# Patient Record
Sex: Female | Born: 1970 | Hispanic: Yes | Marital: Single
Health system: Southern US, Community
[De-identification: ages and names within clinical notes are randomized; demographics above are authoritative.]

## PROBLEM LIST (undated history)

## (undated) DIAGNOSIS — E119 Type 2 diabetes mellitus without complications: Secondary | ICD-10-CM

## (undated) DIAGNOSIS — I10 Essential (primary) hypertension: Secondary | ICD-10-CM

---

## 2019-11-15 ENCOUNTER — Encounter (HOSPITAL_COMMUNITY): Payer: Self-pay | Admitting: Emergency Medicine

## 2019-11-15 ENCOUNTER — Emergency Department (HOSPITAL_COMMUNITY): Payer: Self-pay

## 2019-11-15 ENCOUNTER — Other Ambulatory Visit: Payer: Self-pay

## 2019-11-15 ENCOUNTER — Emergency Department (HOSPITAL_COMMUNITY)
Admission: EM | Admit: 2019-11-15 | Discharge: 2019-11-16 | Disposition: A | Discharge status: Home / Self Care | Payer: Self-pay | Attending: Emergency Medicine | Admitting: Emergency Medicine

## 2019-11-15 DIAGNOSIS — I10 Essential (primary) hypertension: Secondary | ICD-10-CM | POA: Insufficient documentation

## 2019-11-15 DIAGNOSIS — K5900 Constipation, unspecified: Secondary | ICD-10-CM | POA: Insufficient documentation

## 2019-11-15 DIAGNOSIS — E119 Type 2 diabetes mellitus without complications: Secondary | ICD-10-CM | POA: Insufficient documentation

## 2019-11-15 DIAGNOSIS — R109 Unspecified abdominal pain: Secondary | ICD-10-CM | POA: Insufficient documentation

## 2019-11-15 HISTORY — DX: Type 2 diabetes mellitus without complications: E11.9

## 2019-11-15 HISTORY — DX: Essential (primary) hypertension: I10

## 2019-11-15 LAB — CBC WITH DIFFERENTIAL/PLATELET
Abs Immature Granulocytes: 0.01 10*3/uL (ref 0.00–0.07)
Basophils Absolute: 0 10*3/uL (ref 0.0–0.1)
Basophils Relative: 1 %
Eosinophils Absolute: 0.2 10*3/uL (ref 0.0–0.5)
Eosinophils Relative: 3 %
HCT: 43.8 % (ref 36.0–46.0)
Hemoglobin: 15.3 g/dL — ABNORMAL HIGH (ref 12.0–15.0)
Immature Granulocytes: 0 %
Lymphocytes Relative: 35 %
Lymphs Abs: 2.1 10*3/uL (ref 0.7–4.0)
MCH: 28.3 pg (ref 26.0–34.0)
MCHC: 34.9 g/dL (ref 30.0–36.0)
MCV: 81 fL (ref 80.0–100.0)
Monocytes Absolute: 0.4 10*3/uL (ref 0.1–1.0)
Monocytes Relative: 7 %
Neutro Abs: 3.2 10*3/uL (ref 1.7–7.7)
Neutrophils Relative %: 54 %
Platelets: 293 10*3/uL (ref 150–400)
RBC: 5.41 MIL/uL — ABNORMAL HIGH (ref 3.87–5.11)
RDW: 11.9 % (ref 11.5–15.5)
WBC: 6 10*3/uL (ref 4.0–10.5)
nRBC: 0 % (ref 0.0–0.2)

## 2019-11-15 LAB — BASIC METABOLIC PANEL
Anion gap: 13 (ref 5–15)
BUN: 12 mg/dL (ref 6–20)
CO2: 22 mmol/L (ref 22–32)
Calcium: 9.9 mg/dL (ref 8.9–10.3)
Chloride: 97 mmol/L — ABNORMAL LOW (ref 98–111)
Creatinine, Ser: 0.55 mg/dL (ref 0.44–1.00)
GFR, Estimated: >60 mL/min (ref >60)
Glucose, Bld: 479 mg/dL — ABNORMAL HIGH (ref 70–99)
Potassium: 4.2 mmol/L (ref 3.5–5.1)
Sodium: 132 mmol/L — ABNORMAL LOW (ref 135–145)

## 2019-11-15 MED ORDER — LACTATED RINGERS IV BOLUS
1000.0000 mL | Freq: Once | INTRAVENOUS | Status: AC
  Administered 2019-11-15: 1000 mL via INTRAVENOUS

## 2019-11-15 MED ORDER — SORBITOL 70 % SOLN
960.0000 mL | TOPICAL_OIL | Freq: Once | ORAL | Status: AC
  Administered 2019-11-16: 460 mL via RECTAL
  Filled 2019-11-15: qty 473

## 2019-11-15 MED ORDER — FLEET ENEMA 7-19 GM/118ML RE ENEM: 1.0000 | ENEMA | Freq: Once | RECTAL | Status: DC

## 2019-11-15 MED ORDER — ACETAMINOPHEN 325 MG PO TABS
650.0000 mg | ORAL_TABLET | Freq: Once | ORAL | Status: AC
  Administered 2019-11-15: 650 mg via ORAL
  Filled 2019-11-15: qty 2

## 2019-11-15 NOTE — ED Triage Notes (Signed)
Pt from home. Complaint of constipation. Pt states that she has not had a bowel movement in 3 days. Had same thing 3 months ago. VSS.

## 2019-11-15 NOTE — ED Provider Notes (Signed)
[facility] EMERGENCY DEPARTMENT Provider Note   CSN: [id number] Arrival date & time: 11/15/19  1830     History Chief Complaint  Patient presents with  . Constipation    Emma Schwartz is a 49 y.o. female.  Patient reports that about 3 months ago she had a similar episode.  She was living in [place] at the time and had a few days without stools.  She went to an ER where she had a CT scan that showed significant constipation, and she was provided an enema with significant relief of her symptoms.  She reports this episode feels very similar to that episode.  The history is provided by the patient.  Constipation Severity:  Severe Time since last bowel movement:  3 days Timing:  Constant Progression:  Worsening Chronicity:  New Stool description:  None produced Relieved by:  Nothing Worsened by:  Nothing Associated symptoms: abdominal pain and nausea   Associated symptoms: no back pain, no dysuria, no fever, no flatus and no vomiting        Past Medical History:  Diagnosis Date  . Diabetes (HCC)   . Hypertension     History reviewed. No pertinent surgical history.   OB History   No obstetric history on file.     No family history on file.  Social History   Tobacco Use  . Smoking status: Not on file  Substance Use Topics  . Alcohol use: Not on file  . Drug use: Not on file    Home Medications Prior to Admission medications   Not on File    Allergies    Patient has no known allergies.  Review of Systems   Review of Systems  Constitutional: Negative for chills and fever.  HENT: Negative for ear pain and sore throat.   Eyes: Negative for pain and visual disturbance.  Respiratory: Negative for cough and shortness of breath.   Cardiovascular: Negative for chest pain and palpitations.  Gastrointestinal: Positive for abdominal pain, constipation and nausea. Negative for flatus and vomiting.  Genitourinary: Negative for  dysuria and hematuria.  Musculoskeletal: Negative for arthralgias and back pain.  Skin: Negative for color change and rash.  Neurological: Negative for seizures and syncope.  All other systems reviewed and are negative.   Physical Exam Updated Vital Signs BP 138/71 (BP Location: Right Arm)   Pulse 94   Temp 98.3 F (36.8 C) (Oral)   Resp 14   Ht 5\' 8"  (1.727 m)   Wt 86.2 kg   LMP  (LMP Unknown)   SpO2 97%   BMI 28.89 kg/m   Physical Exam Vitals and nursing note reviewed.  Constitutional:      Appearance: She is well-developed. She is not ill-appearing, toxic-appearing or diaphoretic.  HENT:     Head: Normocephalic and atraumatic.     Mouth/Throat:     Mouth: Mucous membranes are moist.     Pharynx: Oropharynx is clear.  Eyes:     Conjunctiva/sclera: Conjunctivae normal.  Cardiovascular:     Rate and Rhythm: Normal rate and regular rhythm.     Heart sounds: No murmur heard.   Pulmonary:     Effort: Pulmonary effort is normal. No respiratory distress.     Breath sounds: Normal breath sounds.  Abdominal:     General: Bowel sounds are decreased. There is distension (mild).     Palpations: Abdomen is soft.     Tenderness: There is generalized abdominal tenderness. There is no  right CVA tenderness, left CVA tenderness, guarding or rebound.  Musculoskeletal:     Cervical back: Neck supple.  Skin:    General: Skin is warm and dry.  Neurological:     General: No focal deficit present.     Mental Status: She is alert.     ED Results / Procedures / Treatments   Labs (all labs ordered are listed, but only abnormal results are displayed) Labs Reviewed  CBC WITH DIFFERENTIAL/PLATELET - Abnormal; Notable for the following components:      Result Value   RBC 5.41 (*)    Hemoglobin 15.3 (*)    All other components within normal limits  BASIC METABOLIC PANEL - Abnormal; Notable for the following components:   Sodium 132 (*)    Chloride 97 (*)    Glucose, Bld 479 (*)     All other components within normal limits    EKG None  Radiology DG Abd 2 Views  Result Date: 11/15/2019 CLINICAL DATA:  Constipation EXAM: ABDOMEN - 2 VIEW COMPARISON:  None. FINDINGS: The bowel gas pattern is nonobstructive. There is a large amount of stool throughout the colon and rectum. There are no large radiopaque kidney stones. IMPRESSION: 1. Nonobstructive bowel gas pattern. 2. Large stool burden. Electronically Signed   By: Katherine Mantle M.D.   On: 11/15/2019 22:32    Procedures Procedures (including critical care time)  Medications Ordered in ED Medications  sorbitol, milk of mag, mineral oil, glycerin (SMOG) enema (has no administration in time range)  acetaminophen (TYLENOL) tablet 650 mg (650 mg Oral Given 11/15/19 2352)  lactated ringers bolus 1,000 mL (1,000 mLs Intravenous New Bag/Given 11/15/19 2356)    ED Course  I have reviewed the triage vital signs and the nursing notes.  Pertinent labs & imaging results that were available during my care of the patient were reviewed by me and considered in my medical decision making (see chart for details).    MDM Rules/Calculators/A&P                          The patient is a 49 year old woman, PMH DM, HTN not on medications for her blood pressure, who presents to the ED for constipation and abdominal pain.  On my initial evaluation, patient is hemodynamically stable, afebrile, nontoxic-appearing.  Physical exam remarkable for minimal abdominal distention with generalized tenderness, decreased bowel sounds.  Differentials considered include SBO, malignancy, ileus, constipation, impaction.  Likely constipation.  Patient is well-appearing and is not vomiting and has no history of surgeries on her belly, low suspicion for SBO or other obstructive process.  Labs remarkable for hyperglycemia, so provided IV fluid bolus and Tylenol for pain.  X-rays of the abdomen remarkable for significant stool burden as interpreted by myself  and by radiology.  Ordered enema to be provided to patient.  Transfer of care at 2330 to oncoming provider.  Plan of care communicated, which is that patient should have glucose rechecked after fluids and should be reassessed after the enema.  As long she is feeling better, patient can be discharged.  If she continues to have the symptoms and does not have much of a response with enema, she may need a CT scan and further evaluation.  Patient stable condition at time of transfer.  The care of this patient was overseen by Dr. Rubin Payor, ED attending, who agreed with evaluation and management of the patient.  Final Clinical Impression(s) / ED Diagnoses Final diagnoses:  Abdominal pain  Constipation, unspecified constipation type    Rx / DC Orders ED Discharge Orders    None       Loletha Carrow, MD 11/16/19 Mike Gip    Benjiman Core, MD 11/19/19 [phone]

## 2019-11-16 ENCOUNTER — Encounter (HOSPITAL_COMMUNITY): Payer: Self-pay | Admitting: Emergency Medicine

## 2019-11-16 MED ORDER — POLYETHYLENE GLYCOL 3350 17 GM/SCOOP PO POWD: 17.0000 g | Freq: Every day | ORAL | 0 refills | Status: AC

## 2019-11-16 NOTE — ED Notes (Signed)
Discharge instructions discussed with pt. Pt verbalized understanding. Pt stable and ambulatory. No signature pad available. 

## 2019-11-16 NOTE — Discharge Instructions (Signed)
Take MiraLAX to help you with your constipation.  Check your blood sugar regularly as it is elevated today.  Follow-up with your doctor for further care.

## 2019-11-16 NOTE — ED Provider Notes (Signed)
constipation x 3 days. SMOG  Enema and reassess.  Recheck CBG.  If no improvement, consider CT scan  2:09 AM Patient received antibiotic, currently actively trying to have bowel movement but on reassessment she reported feeling a bit better.  2:48 AM At this time patient felt much better.  Able to have a large bowel movement request to be discharged.  Will discharge home with MiraLAX.  Encourage patient to monitor her CBG closely as it is elevated today.  BP 134/84   Pulse 85   Temp 98.3 F (36.8 C) (Oral)   Resp 14   Ht 5\' 8"  (1.727 m)   Wt 86.2 kg   LMP  (LMP Unknown)   SpO2 98%   BMI 28.89 kg/m   Results for orders placed or performed during the hospital encounter of 11/15/19  CBC with Differential  Result Value Ref Range   WBC 6.0 4.0 - 10.5 K/uL   RBC 5.41 (H) 3.87 - 5.11 MIL/uL   Hemoglobin 15.3 (H) 12.0 - 15.0 g/dL   HCT 13/07/21 36 - 46 %   MCV 81.0 80.0 - 100.0 fL   MCH 28.3 26.0 - 34.0 pg   MCHC 34.9 30.0 - 36.0 g/dL   RDW 64.3 32.9 - 51.8 %   Platelets 293 150 - 400 K/uL   nRBC 0.0 0.0 - 0.2 %   Neutrophils Relative % 54 %   Neutro Abs 3.2 1.7 - 7.7 K/uL   Lymphocytes Relative 35 %   Lymphs Abs 2.1 0.7 - 4.0 K/uL   Monocytes Relative 7 %   Monocytes Absolute 0.4 0.1 - 1.0 K/uL   Eosinophils Relative 3 %   Eosinophils Absolute 0.2 0.0 - 0.5 K/uL   Basophils Relative 1 %   Basophils Absolute 0.0 0.0 - 0.1 K/uL   Immature Granulocytes 0 %   Abs Immature Granulocytes 0.01 0.00 - 0.07 K/uL  Basic metabolic panel  Result Value Ref Range   Sodium 132 (L) 135 - 145 mmol/L   Potassium 4.2 3.5 - 5.1 mmol/L   Chloride 97 (L) 98 - 111 mmol/L   CO2 22 22 - 32 mmol/L   Glucose, Bld 479 (H) 70 - 99 mg/dL   BUN 12 6 - 20 mg/dL   Creatinine, Ser 84.1 0.44 - 1.00 mg/dL   Calcium 9.9 8.9 - 6.60 mg/dL   GFR, Estimated 63.0 >16 mL/min   Anion gap 13 5 - 15   DG Abd 2 Views  Result Date: 11/15/2019 CLINICAL DATA:  Constipation EXAM: ABDOMEN - 2 VIEW COMPARISON:  None.  FINDINGS: The bowel gas pattern is nonobstructive. There is a large amount of stool throughout the colon and rectum. There are no large radiopaque kidney stones. IMPRESSION: 1. Nonobstructive bowel gas pattern. 2. Large stool burden. Electronically Signed   By: 13/07/2019 M.D.   On: 11/15/2019 22:32      13/07/2019, PA-C 11/16/19 0249    Mesner, 13/08/21, MD 11/16/19 [phone]

## 2021-11-20 IMAGING — CR DG ABDOMEN 2V
2 series · 2 of 2 positions shown · non-contrast
Comparison: None.

CLINICAL DATA: Constipation

EXAM:
ABDOMEN - 2 VIEW

[abdomen erect]
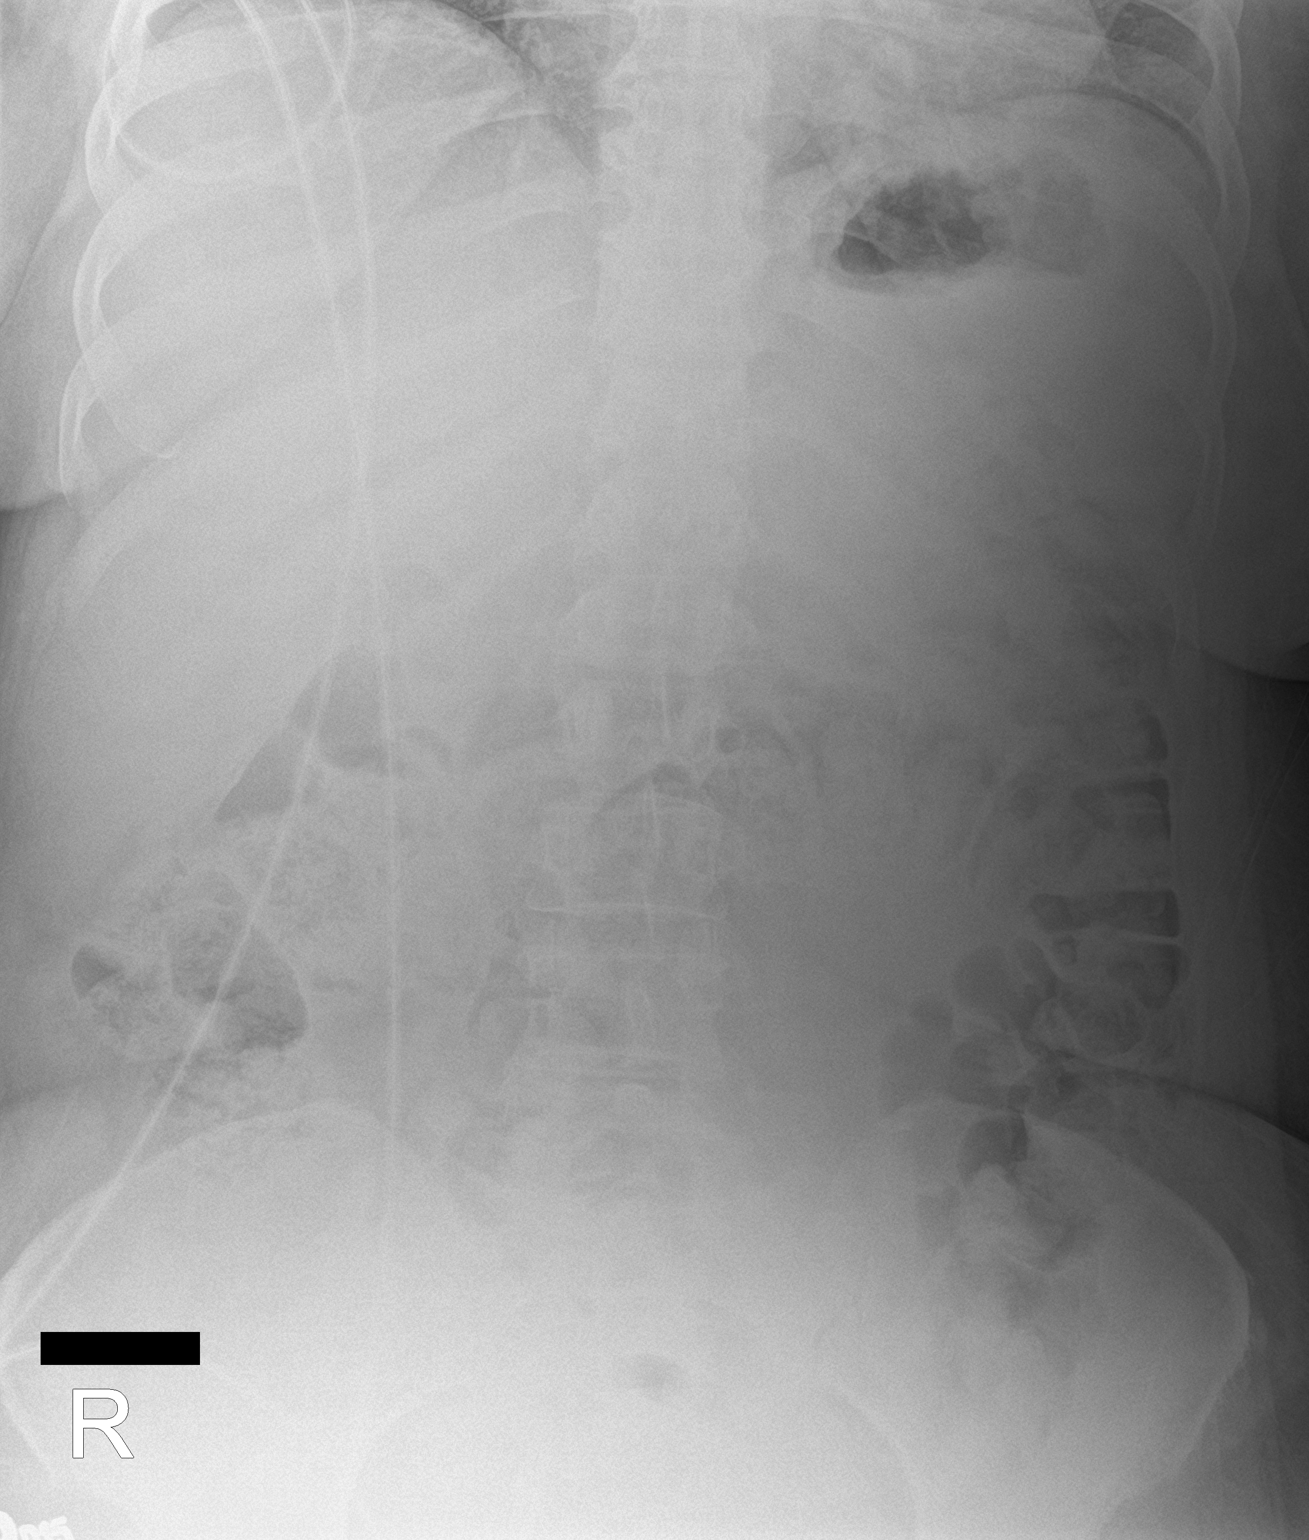

[abdomen supine]
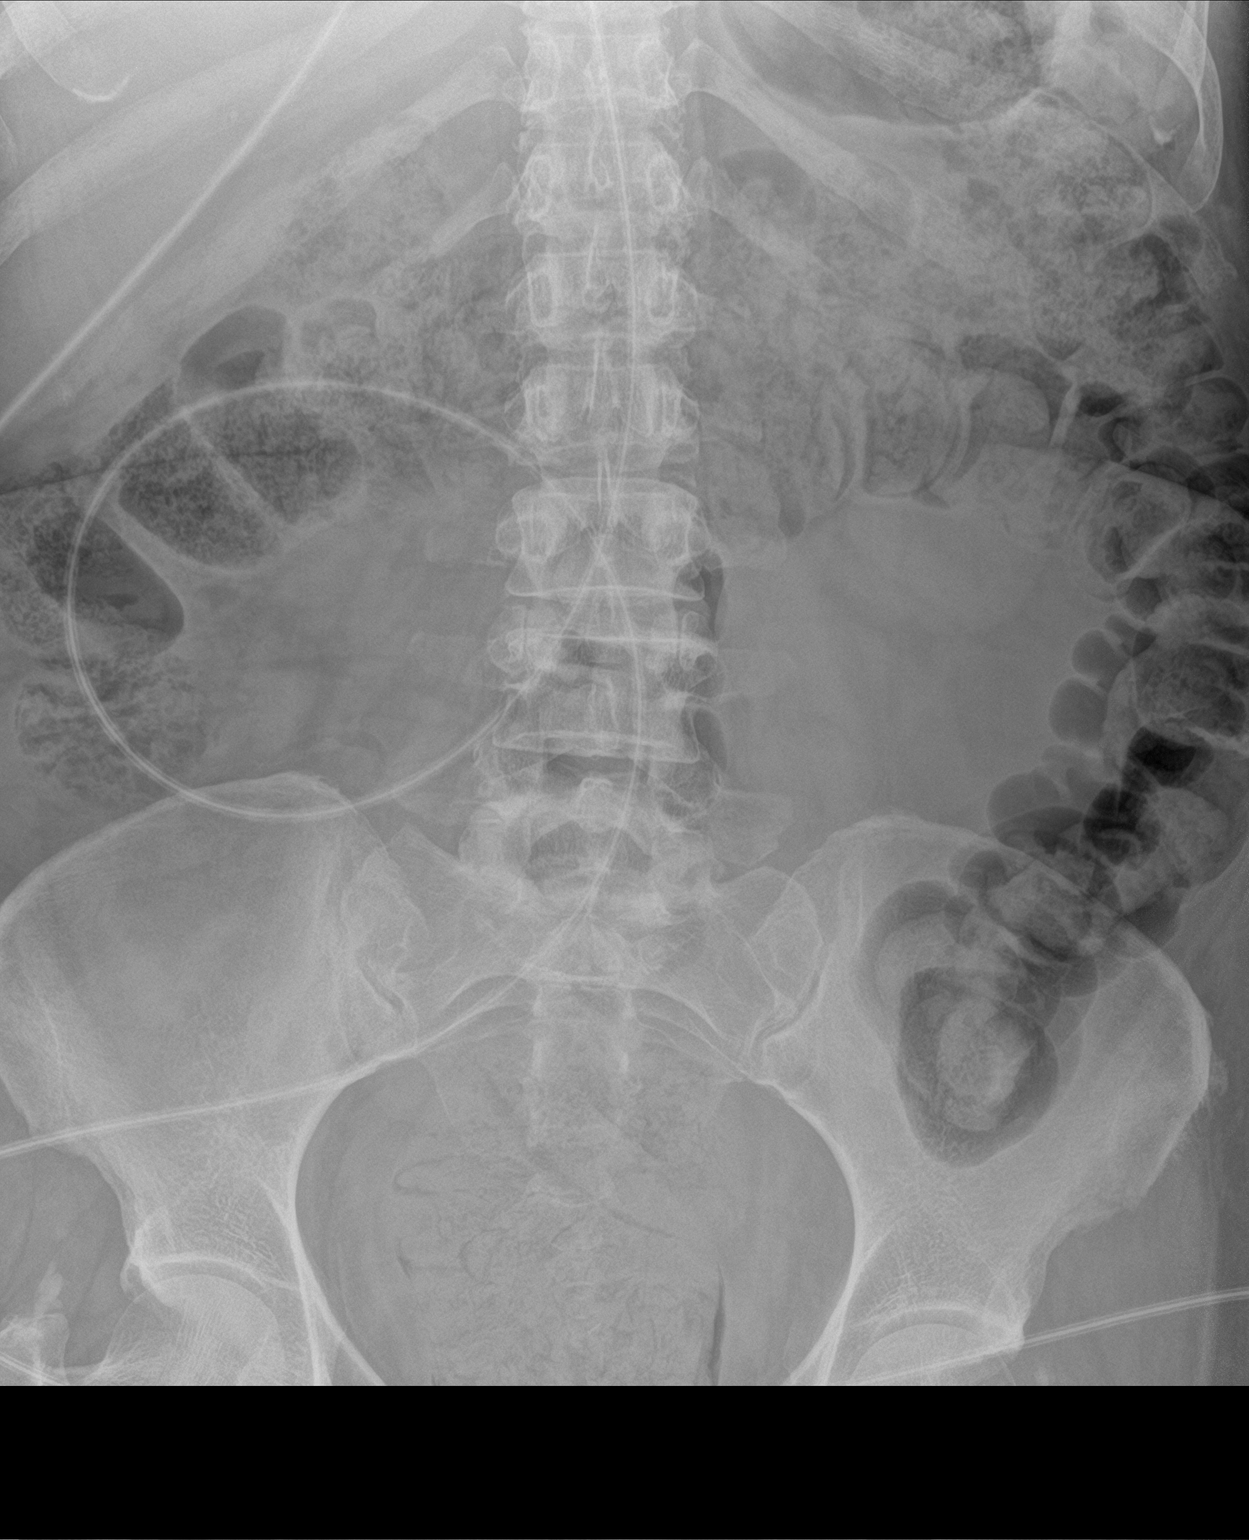

[2 of 2 positions shown; findings below may reference images not displayed]

FINDINGS: The bowel gas pattern is nonobstructive. There is a large amount of
stool throughout the colon and rectum. There are no large radiopaque
kidney stones.
IMPRESSION: 1. Nonobstructive bowel gas pattern.
2. Large stool burden.
